# Patient Record
Sex: Female | Born: 1955 | Race: White | Hispanic: No | Marital: Married | Smoking: Never smoker
Health system: Southern US, Community
[De-identification: ages and names within clinical notes are randomized; demographics above are authoritative.]

## PROBLEM LIST (undated history)

## (undated) DIAGNOSIS — I10 Essential (primary) hypertension: Secondary | ICD-10-CM

## (undated) DIAGNOSIS — E785 Hyperlipidemia, unspecified: Secondary | ICD-10-CM

## (undated) DIAGNOSIS — K589 Irritable bowel syndrome without diarrhea: Secondary | ICD-10-CM

## (undated) HISTORY — DX: Hyperlipidemia, unspecified: E78.5

## (undated) HISTORY — DX: Irritable bowel syndrome, unspecified: K58.9

## (undated) HISTORY — DX: Essential (primary) hypertension: I10

## (undated) HISTORY — PX: TONSILLECTOMY: SHX5217

## (undated) HISTORY — PX: COLONOSCOPY: SHX174

---

## 2011-10-27 ENCOUNTER — Ambulatory Visit: Payer: Self-pay | Admitting: Internal Medicine

## 2011-11-26 ENCOUNTER — Ambulatory Visit: Payer: Self-pay | Admitting: Internal Medicine

## 2014-10-26 ENCOUNTER — Ambulatory Visit: Payer: Self-pay | Admitting: Internal Medicine

## 2015-12-21 ENCOUNTER — Other Ambulatory Visit: Payer: Self-pay | Admitting: Internal Medicine

## 2015-12-21 DIAGNOSIS — R911 Solitary pulmonary nodule: Secondary | ICD-10-CM

## 2015-12-26 ENCOUNTER — Ambulatory Visit
Admission: RE | Admit: 2015-12-26 | Discharge: 2015-12-26 | Disposition: A | Discharge status: Home / Self Care | Source: Ambulatory Visit | Attending: Internal Medicine | Admitting: Internal Medicine

## 2015-12-26 DIAGNOSIS — R911 Solitary pulmonary nodule: Secondary | ICD-10-CM | POA: Diagnosis present

## 2017-04-12 IMAGING — CT CT CHEST W/O CM
2 of 4 series · 15 of 36 positions shown, 18 images · non-contrast
Comparison: None.

CLINICAL DATA: Abnormal chest radiograph

EXAM:
CT CHEST WITHOUT CONTRAST
TECHNIQUE: Multidetector CT imaging of the chest was performed following the
standard protocol without IV contrast.

[Series 3: lungs · axial · 0.62mm/px · z∈[-910,-664]mm · 12 of 139 slices shown, 15 images]
[im 8/139  mediastinal]
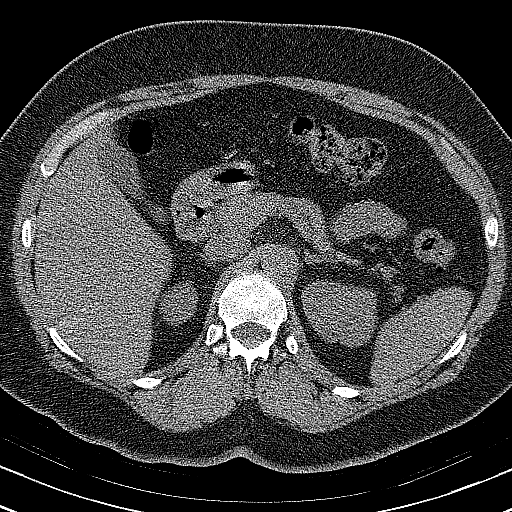
[im 8/139  lung]
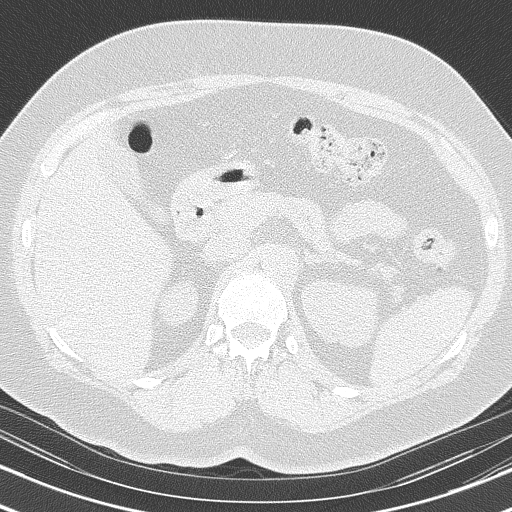
[im 22/139  lung]
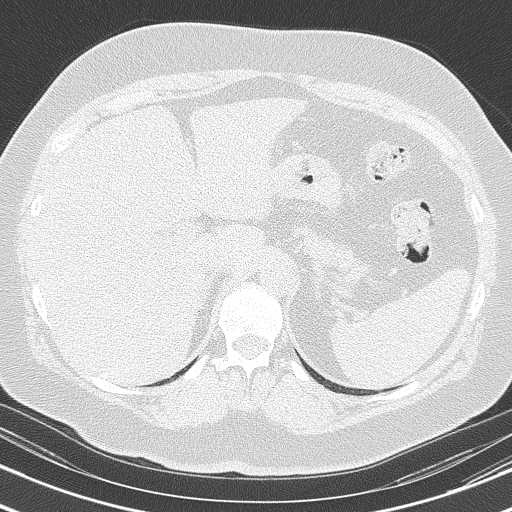
[im 30/139  lung]
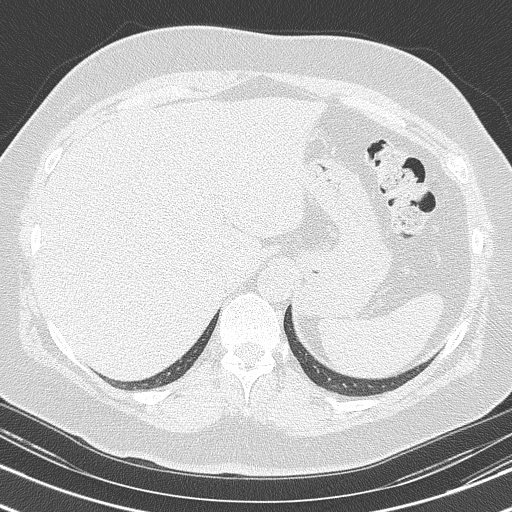
[im 44/139  lung]
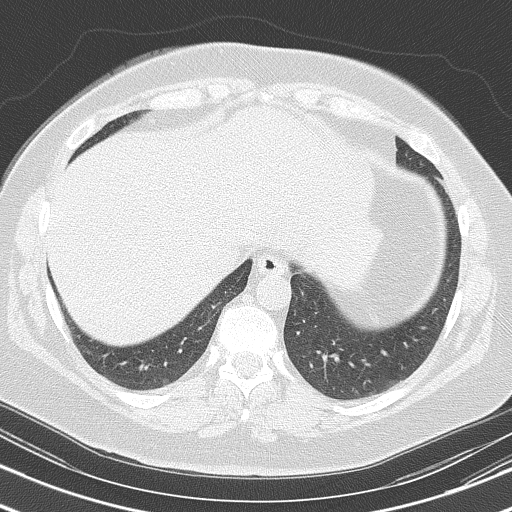
[im 51/139  mediastinal]
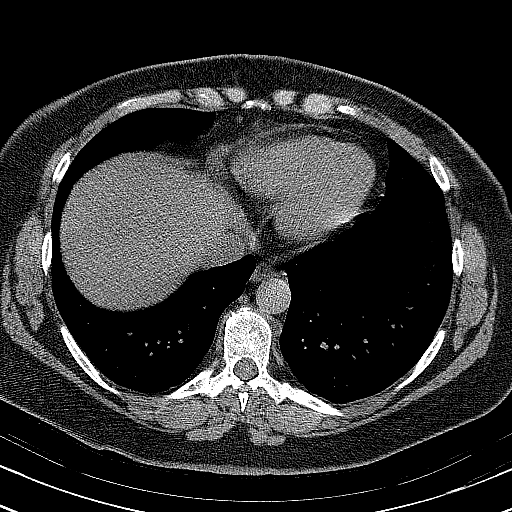
[im 51/139  lung]
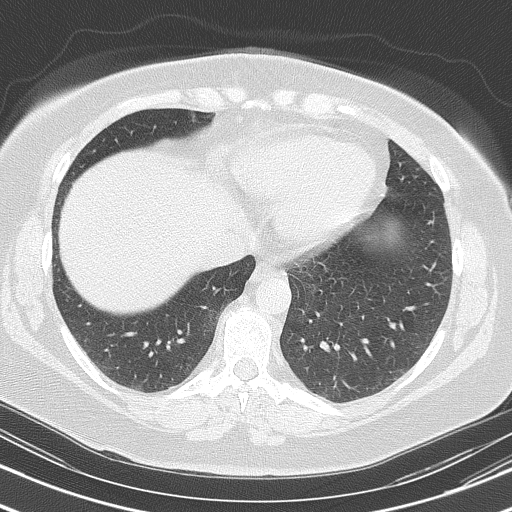
[im 66/139  lung]
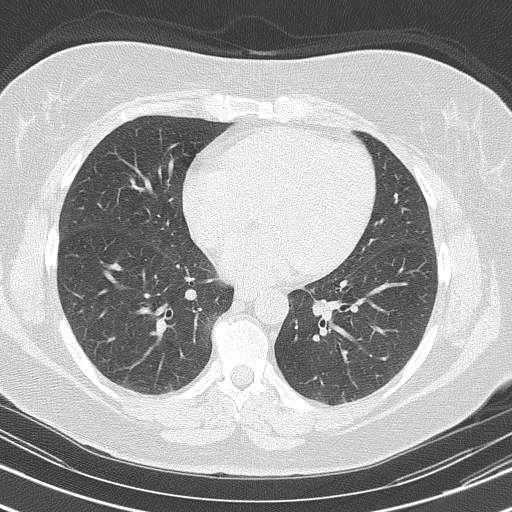
[im 73/139  lung]
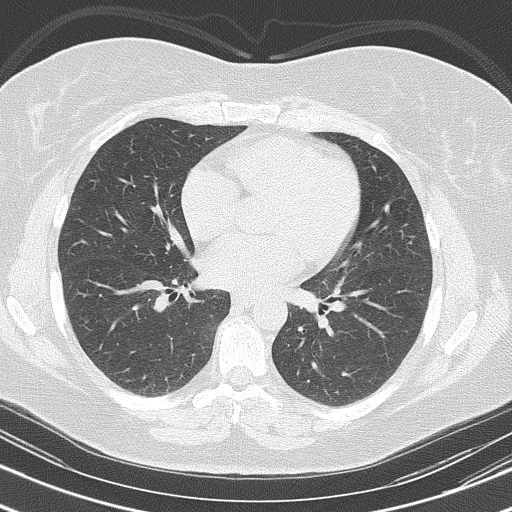
[im 88/139  lung]
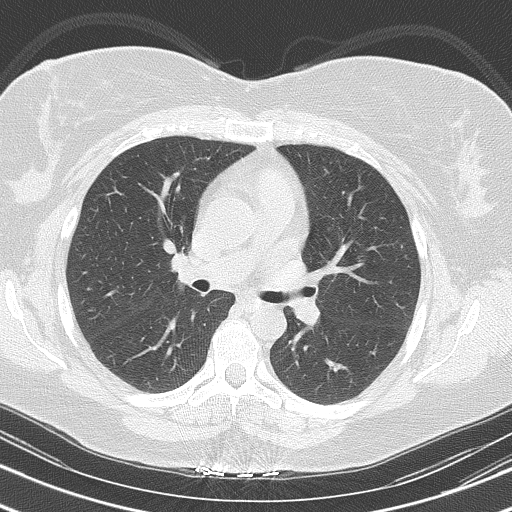
[im 95/139  mediastinal]
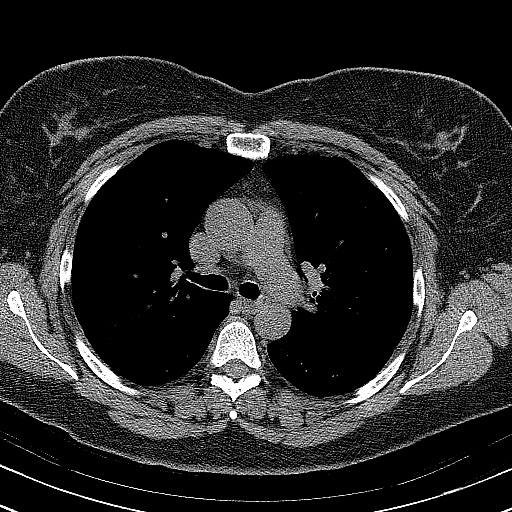
[im 95/139  lung]
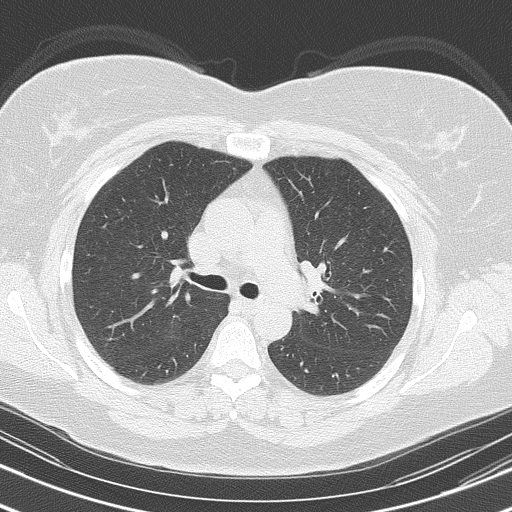
[im 109/139  lung]
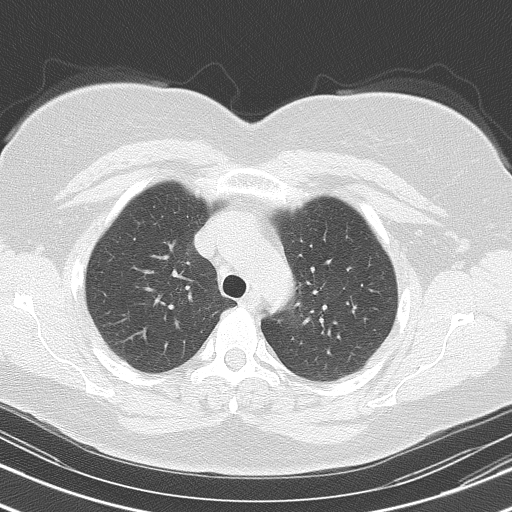
[im 117/139  lung]
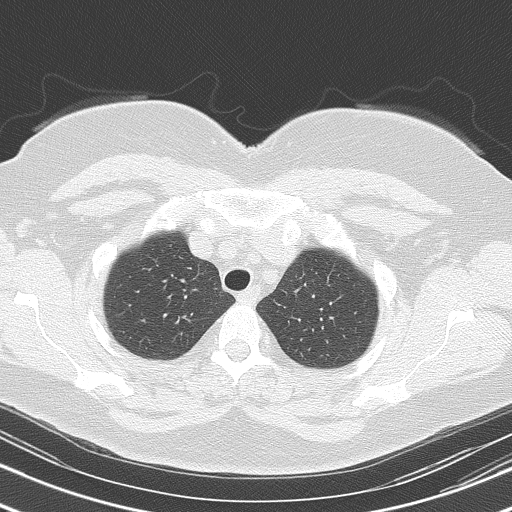
[im 131/139  lung]
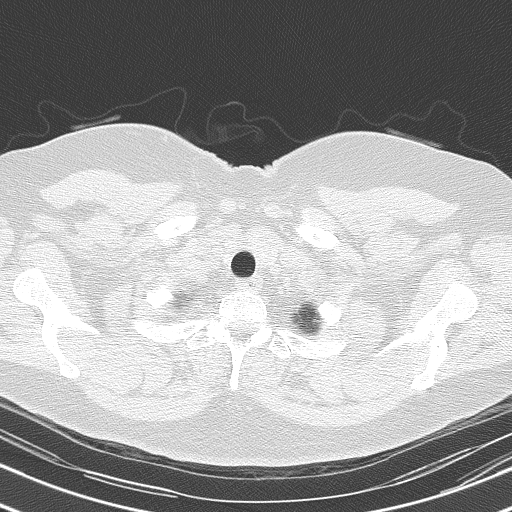

[Series 602: coronal · coronal · 0.62mm/px · 3 of 117 slices shown]
[im 24/117  lung]
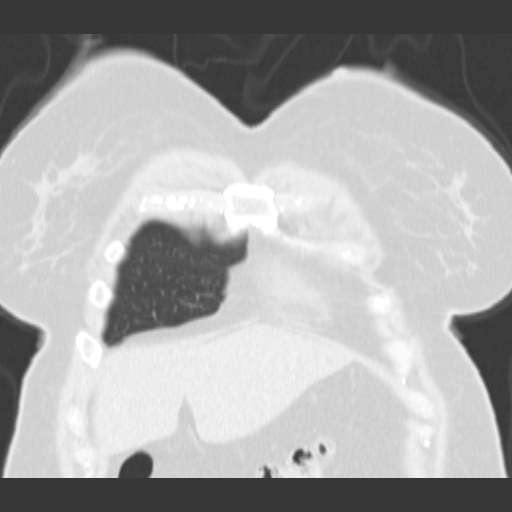
[im 47/117  lung]
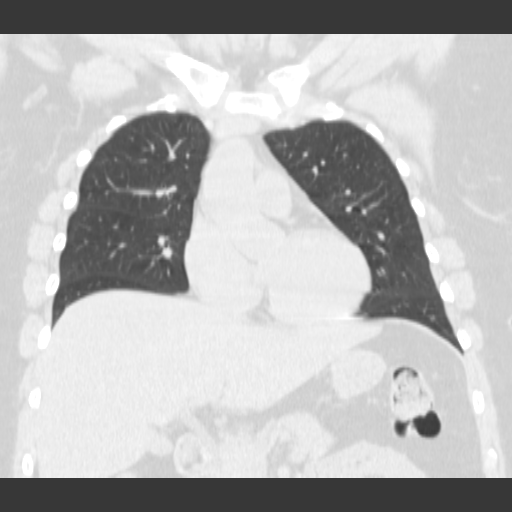
[im 70/117  lung]
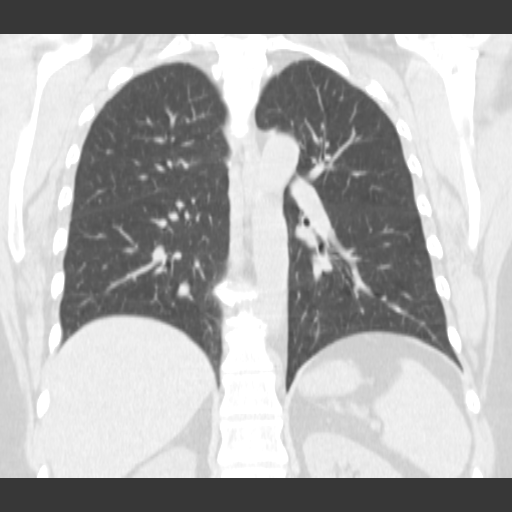

[15 of 36 positions shown; findings below may reference images not displayed]

FINDINGS: Mediastinum/Nodes: The heart is normal in size. No pericardial
effusion.

No evidence of thoracic aortic aneurysm.

Small mediastinal lymph nodes, including a 9 mm short axis superior
mediastinal node (series 2/ image 9) and a 7 mm short axis low right
paratracheal node (series 2/ image 19).

Lungs/Pleura: 3 mm triangular nodule in the right middle lobe
(series 3/ image 57), likely reflecting a benign subpleural lymph
node.

Lungs are otherwise essentially clear.

Mild compressive atelectasis in the medial right lower lobe.

No focal consolidation.

No pleural effusion or pneumothorax.

Upper abdomen: Visualized upper abdomen is unremarkable.

Musculoskeletal: Mild degenerative changes of the visualized
thoracolumbar spine.
IMPRESSION: 3 mm triangular nodule in the right middle lobe, likely reflecting a
benign subpleural lymph node.

No follow-up needed if patient is low-risk. Non-contrast chest CT
can be considered in 12 months if patient is high-risk.

This recommendation follows the consensus statement: Guidelines for
Management of Incidental Pulmonary Nodules Detected on CT
Images:From the [HOSPITAL] 2726; published online before
print (10.1148/[id number]).

## 2017-12-15 ENCOUNTER — Other Ambulatory Visit: Payer: Self-pay | Admitting: Internal Medicine

## 2017-12-15 DIAGNOSIS — M79662 Pain in left lower leg: Secondary | ICD-10-CM

## 2017-12-16 ENCOUNTER — Other Ambulatory Visit (HOSPITAL_COMMUNITY): Payer: Self-pay | Admitting: Internal Medicine

## 2017-12-16 DIAGNOSIS — M79662 Pain in left lower leg: Secondary | ICD-10-CM

## 2017-12-18 ENCOUNTER — Ambulatory Visit (HOSPITAL_COMMUNITY)
Admission: RE | Admit: 2017-12-18 | Discharge: 2017-12-18 | Disposition: A | Discharge status: Home / Self Care | Source: Ambulatory Visit | Attending: Internal Medicine | Admitting: Internal Medicine

## 2017-12-18 DIAGNOSIS — M79662 Pain in left lower leg: Secondary | ICD-10-CM | POA: Insufficient documentation

## 2019-06-08 ENCOUNTER — Ambulatory Visit: Payer: Self-pay | Admitting: Urology

## 2019-06-29 ENCOUNTER — Ambulatory Visit: Payer: Self-pay | Admitting: Urology

## 2019-07-22 ENCOUNTER — Ambulatory Visit (INDEPENDENT_AMBULATORY_CARE_PROVIDER_SITE_OTHER): Admitting: Urology

## 2019-07-22 ENCOUNTER — Encounter: Payer: Self-pay | Admitting: Urology

## 2019-07-22 ENCOUNTER — Other Ambulatory Visit: Payer: Self-pay

## 2019-07-22 VITALS — BP 162/102 | HR 75 | Ht 64.0 in | Wt 165.0 lb

## 2019-07-22 DIAGNOSIS — R3915 Urgency of urination: Secondary | ICD-10-CM | POA: Diagnosis not present

## 2019-07-22 DIAGNOSIS — N3941 Urge incontinence: Secondary | ICD-10-CM | POA: Diagnosis not present

## 2019-07-22 DIAGNOSIS — N393 Stress incontinence (female) (male): Secondary | ICD-10-CM | POA: Diagnosis not present

## 2019-07-22 DIAGNOSIS — N3281 Overactive bladder: Secondary | ICD-10-CM

## 2019-07-22 LAB — URINALYSIS, COMPLETE
Bilirubin, UA: NEGATIVE
Glucose, UA: NEGATIVE
Ketones, UA: NEGATIVE
Leukocytes,UA: NEGATIVE
Nitrite, UA: NEGATIVE
Protein,UA: NEGATIVE
Specific Gravity, UA: <1.005 — ABNORMAL LOW (ref 1.005–1.030)
Urobilinogen, Ur: 0.2 mg/dL (ref 0.2–1.0)
pH, UA: 6 (ref 5.0–7.5)

## 2019-07-22 LAB — MICROSCOPIC EXAMINATION: Bacteria, UA: NONE SEEN

## 2019-07-22 LAB — BLADDER SCAN AMB NON-IMAGING

## 2019-07-22 NOTE — Progress Notes (Signed)
07/22/2019 11:56 AM   Melissa Burnett November 14, 1955 [id number]  Referring provider: Idelle Crouch, MD [facility] [place],  [place] [address]  Chief Complaint  Patient presents with  . Urinary Urgency    New patient    HPI: Melissa Burnett is a 63 y.o. female seen in consultation at the request of Mortimer Fries, [place] for evaluation of overactive bladder symptoms.  She was seen in September 2020 with a 46-month history of urinary frequency, urgency and urge incontinence.  She also has mild stress incontinence.  Denies bowel symptoms or recurrent UTI.  No history of neurologic diseases including MS, Parkinson's or CVA.  Denies recurrent UTI or gross hematuria.  She had a urinalysis and urine culture by her PCP which were unremarkable.  She was not treated for her symptoms.  One prior vaginal delivery.  No previous hysterectomy.   PMH: Past Medical History:  Diagnosis Date  . Hyperlipidemia   . Hypertension   . IBS (irritable bowel syndrome)     Surgical History: Past Surgical History:  Procedure Laterality Date  . COLONOSCOPY    . TONSILLECTOMY      Home Medications:  Allergies as of 07/22/2019      Reactions   Penicillin G Other (See Comments)   Skin peeled off hands and feet   Sulfa Antibiotics Rash      Medication List       Accurate as of July 22, 2019 11:56 AM. If you have any questions, ask your nurse or doctor.        rosuvastatin 10 MG tablet Commonly known as: CRESTOR TAKE 1 TABLET DAILY       Allergies:  Allergies  Allergen Reactions  . Penicillin G Other (See Comments)    Skin peeled off hands and feet  . Sulfa Antibiotics Rash    Family History: No family history on file.  Social History:  reports that she has never smoked. She has never used smokeless tobacco. She reports previous alcohol use. No history on file for drug.  ROS: UROLOGY Frequent Urination?: Yes Hard to postpone urination?: Yes Burning/pain with  urination?: No Get up at night to urinate?: Yes Leakage of urine?: Yes Urine stream starts and stops?: No Trouble starting stream?: No Do you have to strain to urinate?: No Blood in urine?: No Urinary tract infection?: No Sexually transmitted disease?: No Injury to kidneys or bladder?: No Painful intercourse?: No Weak stream?: No Currently pregnant?: No Vaginal bleeding?: No Last menstrual period?: N  Gastrointestinal Nausea?: No Vomiting?: No Indigestion/heartburn?: No Diarrhea?: No Constipation?: No  Constitutional Fever: No Night sweats?: No Weight loss?: No Fatigue?: No  Skin Skin rash/lesions?: No Itching?: No  Eyes Blurred vision?: No Double vision?: No  Ears/Nose/Throat Sore throat?: No Sinus problems?: No  Hematologic/Lymphatic Swollen glands?: No Easy bruising?: No  Cardiovascular Leg swelling?: No Chest pain?: No  Respiratory Cough?: No Shortness of breath?: No  Endocrine Excessive thirst?: No  Musculoskeletal Back pain?: No Joint pain?: No  Neurological Headaches?: No Dizziness?: No  Psychologic Depression?: No Anxiety?: No  Physical Exam: BP (!) 162/102   Pulse 75   Ht 5\' 4"  (1.626 m)   Wt 165 lb (74.8 kg)   BMI 28.32 kg/m   Constitutional:  Alert and oriented, No acute distress. HEENT: [place] AT, moist mucus membranes.  Trachea midline, no masses. Cardiovascular: No clubbing, cyanosis, or edema. Respiratory: Normal respiratory effort, no increased work of breathing. GI: Abdomen is soft, nontender, nondistended,  no abdominal masses GU: External genitalia without abnormalities.  Vaginal mucosa atrophic.  Urethral meatus normal in appearance.  Grade 1 cystocele.  No rectocele.  Bimanual no pelvic masses or bladder tenderness. Lymph: No inguinal lymphadenopathy. Skin: No rashes, bruises or suspicious lesions. Neurologic: Grossly intact, no focal deficits, moving all 4 extremities. Psychiatric: Normal mood and affect.   Laboratory Data:  Urinalysis Dipstick/microscopy negative  Assessment & Plan:    - Overactive bladder with urge incontinence We discussed overactive bladder diagnosis.  PVR by bladder scan was 12 mL.  Management options were reviewed including behavioral therapy, medical management and pelvic floor rehabilitation.  She is interested in pursuing pelvic floor rehab and referral was sent to PT.  - Stress urinary incontinence Mild stress incontinence which pelvic floor rehab should also improve.  Follow-up as needed for persistent symptoms.   Riki Altes, MD  [facility] [address], [address] [place], [place] [address] [phone]

## 2019-08-24 ENCOUNTER — Encounter: Payer: Self-pay | Admitting: Physical Therapy

## 2019-08-24 ENCOUNTER — Ambulatory Visit: Attending: Urology | Admitting: Physical Therapy

## 2019-08-24 ENCOUNTER — Other Ambulatory Visit: Payer: Self-pay

## 2019-08-24 DIAGNOSIS — M6281 Muscle weakness (generalized): Secondary | ICD-10-CM | POA: Diagnosis present

## 2019-08-24 DIAGNOSIS — M533 Sacrococcygeal disorders, not elsewhere classified: Secondary | ICD-10-CM | POA: Insufficient documentation

## 2019-08-24 NOTE — Patient Instructions (Addendum)
     Currently:  1 ( 20 fl oz)  tea,  (24 fl oz) of coffee, diluted juice ( 20 fl oz.)  = 64 fl oz bladder irritants  No water      Decrease bladder irritants: 12 fl oz coffee, and 10 fl of tea and 10 fl oz of juice = 32 fl oz bladder irritants Water 16 fl oz ( lemon)   The next, up the water to 24 fl oz  ( tumblers)   ___   3- point tap ,\ Unlock knees, hip above knee,  feet hip width apart  10 reps each x 3 x day

## 2019-08-24 NOTE — Therapy (Signed)
[place] MAIN [facility] SERVICES [address] [place], [place], [address] Phone: [phone]   Fax:  [phone]  Physical Therapy Evaluation  Patient Details  Name: Melissa Burnett MRN: [id number] Date of Birth: 05-Aug-1956 Referring Provider (PT): Stoiff    Encounter Date: 08/24/2019  PT End of Session - 08/24/19 1158    Visit Number  1    Number of Visits  10   eval 08/24/19   Date for PT Re-Evaluation  11/02/19    PT Start Time  1103    PT Stop Time  1159    PT Time Calculation (min)  56 min       Past Medical History:  Diagnosis Date  . Hyperlipidemia   . Hypertension   . IBS (irritable bowel syndrome)     Past Surgical History:  Procedure Laterality Date  . COLONOSCOPY    . TONSILLECTOMY      There were no vitals filed for this visit.   Subjective Assessment - 08/24/19 1109    Subjective 1) mix incontinence:  Pt reports urinary leakage started 6 months ago with lifting and moving boxes. Pt also noticed urge to go quickly . Pt has leakage before making it before the bathrooom 90% of the time . Pt uses 4-6 x pads per day. Pt does not get up in the middle of the night ( zero nocturia episodes) . Pt feels she does not emptyly empty all the way with urination. Daily fluid intake: Summer time 3-4 ( 20 fl oz) diluted tea, Winter time 1 ( 20 fl oz)  tea,  2 - 12 fl oz of coffee, pt does not like actual water,  Diluted juice 20 fl oz.     2) IBS-diarrhea:  bouts of IBS occuring 3 x a month. Pt does not eat enough fiber. Pt takes fiber pills ,. Likes veggies. no straining with BMs. Without IBS bouts, pt goes 2 x day. [place] Stool Type 4 , with IBS episodes Type 6-7    3)  tailbone discomfort with sitting in same position > 15 min. Hx of fall onto her tailbone and injury to the tailbone during childbirth    4) sciatic pain on L traveling down about knee. 2-3/10. It gets tight and pt does stretches . [place], climbing ladders, and  opening up a store.     Pertinent History           [facility] PT Assessment - 08/24/19 1142      Assessment   Medical Diagnosis  stress incontinence    Referring Provider (PT)  Stoiff       Precautions   Precautions  None      Restrictions   Weight Bearing Restrictions  No      Balance Screen   Has the patient fallen in the past 6 months  No      Observation/Other Assessments   Observations  L shoulder lower      Coordination   Gross Motor Movements are Fluid and Coordinated  --   chest breathing      AROM   Overall AROM Comments  WFL spinal movements, no pain .       PROM   Overall PROM Comments  R hip  ext preTx 0 deg, post Tx ~ 20 deg       Strength   Overall Strength Comments  R hip flex/knee flex/ ext 4-/5, L 5/5  Palpation   Spinal mobility  slight L convex curve     SI assessment   tenderness/ restrictions R sacral nutation ( post Tx improved, no tenderness)     Palpation comment  L iliac crest higher in stance ,  coccyx deviated to R,       Bed Mobility   Bed Mobility  --   head crunch               Objective measurements completed on examination: See above findings.    Pelvic Floor Special Questions - 08/24/19 1158    Diastasis Recti  to assess next session     External Perineal Exam  through clothing: L anterior mm tightness.  proper activation of pelvic floor with exhalation       [facility] Adult PT Treatment/Exercise - 08/24/19 1142      Therapeutic Activites    Therapeutic Activities  --   discussed bladder irritants, increasing water intake,    Other Therapeutic Activities  --   completing food diary to return, anatomy/ physiology     Neuro Re-ed    Neuro Re-ed Details   cued for new HEP      Modalities   Modalities  --      Manual Therapy   Manual therapy comments  long axis distraction on R, roational mob to promote SIJ mobility on R                   PT Long Term Goals - 08/24/19 1111      PT LONG TERM  GOAL #1   Title  Ptdecrease  leakage before making it before the bathrooom 90% of the time to 50% of the time to demo continence    Time  6    Period  Weeks    Status  New    Target Date  10/05/19      PT LONG TERM GOAL #2   Title  Pt will report changing pads from 4-6 x day to < 3 x day in order to practice hygiene and to perform tasks    Time  8    Period  Weeks    Status  New    Target Date  10/19/19      PT LONG TERM GOAL #3   Title  Pt will decrease her bladder inrritant intake by 50% and increase her water intake by 50% in order to promote bladder health and less trips tot he bathroom    Time  4    Period  Weeks    Status  New    Target Date  09/21/19      PT LONG TERM GOAL #4   Title  PT will report decreased IBS episodes from 3 x month to < 2 x month and stool type from 6-7 to 4-5 in order to promote bowel and GI health    Time  8    Period  Weeks    Status  New    Target Date  10/19/19      PT LONG TERM GOAL #5   Title  Pt will demo proper deep core coordination and strengtehning exericses to promote intraabdominal press for motility, urinary and bowel function    Time  10    Period  Weeks    Status  New      Additional Long Term Goals   Additional Long Term Goals  --      PT LONG TERM GOAL #6  Title  Pt will be sit for > 15 min without moving and shifting and feel comfort on her tailbone in order to have dinner with family    Time  4    Period  Weeks    Status  New    Target Date  09/21/19      PT LONG TERM GOAL #7   Title  Pt will will report no sciatic Sx across 1 month 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 These deficits impact her functional mobility and ADLs. Pt presents with  weak hip weakness, SIJ hypomobility, one sided pelvic floor mm tightness, pelvic obliquities, poor body mechanics which places strain on the abdominal/pelvic floor mm. These are deficits that indicate an ineffective intraabdominal pressure system associated with increased risk for urinary leakage, motility, and spinal stability.   Pt was provided education on etiology of Sx with anatomy, physiology explanation with images along with the benefits of customized pelvic PT Tx based on pt's medical conditions and musculoskeletal deficits.  Explained the physiology of deep core mm coordination and roles of pelvic floor function in urination, defecation, sexual function, and postural control with deep core mm system.    Regional interdependent approaches will yield greater benefits in pt's POC due to the complexity of her medical Hx and the significant impact their Sx have had on their QOL.   Pt's Hx of fall onto tailbone many years ago along with tailbone injury during childbirth are likely contributing factors to tailbone discomfort. IBS-diarrhea are likely impacted by her poor water intake and high intake of tea, juice, and coffees but pt was asked to complete food diary to return at next session to better understand fiber intake and refer to nutritionist if need.     Following Tx today which pt tolerated without complaints, pt demo'd leveled iliac crest alignment,  increased R SIJ mobility, increased hip ext AROM, increased sacral nutation. Pt was educated about decreasing bladder irritants, increasing water intake and provided motivational interviewing to increase compliance.    Pt  benefits from skilled PT.      Personal Factors and Comorbidities  Age;Comorbidity 2    Comorbidities  IBS HTN, cholesterol    Examination-Activity Limitations  Sit    Stability/Clinical Decision Making  Evolving/Moderate complexity    Clinical Decision Making  Moderate    Rehab Potential  Good    PT Frequency  1x / week    PT Duration  Other (comment)   10   PT Treatment/Interventions  Manual techniques;Patient/family education;Therapeutic exercise;Therapeutic activities;Functional mobility training;Stair training;Neuromuscular re-education;Energy conservation;Moist Heat    Consulted and Agree  with Plan of Care  Patient       Patient will benefit from skilled therapeutic intervention in order to improve the following deficits and impairments:  Decreased mobility, Decreased coordination, Decreased range of motion, Decreased strength, Decreased endurance, Decreased safety awareness, Improper body mechanics, Postural dysfunction, Increased muscle spasms, Hypomobility, Pain  Visit Diagnosis: Sacrococcygeal disorders, not elsewhere classified  Muscle weakness (generalized)     Problem List Patient Active Problem List   Diagnosis Date Noted  . Overactive bladder 07/22/2019  . Urge incontinence 07/22/2019  . Stress incontinence, female 07/22/2019    Mariane Masters 08/24/2019, 2:16 PM  [facility] MAIN [facility] SERVICES [address] [place], [place], [address] Phone: [phone]   Fax:  [phone]  Name: Sadira Standard MRN: [id number] Date of Birth: 11/01/1955

## 2019-09-01 ENCOUNTER — Encounter: Admitting: Physical Therapy

## 2019-09-08 ENCOUNTER — Ambulatory Visit: Admitting: Physical Therapy

## 2019-09-08 ENCOUNTER — Other Ambulatory Visit: Payer: Self-pay

## 2019-09-08 DIAGNOSIS — M6281 Muscle weakness (generalized): Secondary | ICD-10-CM

## 2019-09-08 DIAGNOSIS — M533 Sacrococcygeal disorders, not elsewhere classified: Secondary | ICD-10-CM | POA: Diagnosis not present

## 2019-09-08 NOTE — Therapy (Signed)
[place] [facility] MAIN [facility] SERVICES [address] [place], [place], [address] Phone: [phone]   Fax:  [phone]  Physical Therapy Treatment  Patient Details  Name: Melissa Burnett MRN: [id number] Date of Birth: 27-Jul-1956 Referring Provider (PT): Stoiff    Encounter Date: 09/08/2019  PT End of Session - 09/08/19 1126    Visit Number  2    Number of Visits  10   eval 08/24/19   Date for PT Re-Evaluation  11/02/19    PT Start Time  1107    PT Stop Time  1202    PT Time Calculation (min)  55 min       Past Medical History:  Diagnosis Date  . Hyperlipidemia   . Hypertension   . IBS (irritable bowel syndrome)     Past Surgical History:  Procedure Laterality Date  . COLONOSCOPY    . TONSILLECTOMY      There were no vitals filed for this visit.  Subjective Assessment - 09/08/19 1111    Subjective  Pt forgot to bring in her food diary . Pt feels like her balance is a little of with the toe tap exercise.  Pt is working out at [employer] and notices leakage is better. Pt is using the crunch machine, recumbent elliptical, leg press machine, tricep, bicep, pull down. .    Pertinent History  LBP 3 months ago, pt was worries about getting a kidney stone. Pt took a supplement for 30 days and decreased tea. Pt has not had problems.  1 vaginal delivery.  Fall onto her tailbone and injury to the tailbone during childbirth         [facility] PT Assessment - 09/08/19 1156      Observation/Other Assessments   Observations  L shoulder lower      Palpation   Spinal mobility  increased tightness at medial scapular, paraspinals    post Tx: L convex lumbar curved decreased    Palpation comment  iliac crest levelled                    [facility] Adult PT Treatment/Exercise - 09/08/19 1157      Therapeutic Activites    Other Therapeutic Activities  modification to fitness routine to minimize straining pelvic floor      Neuro Re-ed    Neuro Re-ed  Details   technique for log rolling and exhalation on exertion in fitness routines      Exercises   Exercises  --   see pt isntruction to increase thoracic rotation     Moist Heat Therapy   Number Minutes Moist Heat  --   5   Moist Heat Location  --   thoracic     Manual Therapy   Manual therapy comments  medial scapular, parspinals mm releases with SMT/ MWM                   PT Long Term Goals - 08/24/19 1111      PT LONG TERM GOAL #1   Title  Ptdecrease  leakage before making it before the bathrooom 90% of the time to 50% of the time to demo continence    Time  6    Period  Weeks    Status  New    Target Date  10/05/19      PT LONG TERM GOAL #2   Title  Pt will report changing pads from 4-6 x day to < 3  x day in order to practice hygiene and to perform tasks    Time  8    Period  Weeks    Status  New    Target Date  10/19/19      PT LONG TERM GOAL #3   Title  Pt will decrease her bladder inrritant intake by 50% and increase her water intake by 50% in order to promote bladder health and less trips tot he bathroom    Time  4    Period  Weeks    Status  New    Target Date  09/21/19      PT LONG TERM GOAL #4   Title  PT will report decreased IBS episodes from 3 x month to < 2 x month and stool type from 6-7 to 4-5 in order to promote bowel and GI health    Time  8    Period  Weeks    Status  New    Target Date  10/19/19      PT LONG TERM GOAL #5   Title  Pt will demo proper deep core coordination and strengtehning exericses to promote intraabdominal press for motility, urinary and bowel function    Time  10    Period  Weeks    Status  New      Additional Long Term Goals   Additional Long Term Goals  --      PT LONG TERM GOAL #6   Title  Pt will be sit for > 15 min without moving and shifting and feel comfort on her tailbone in order to have dinner with family    Time  4    Period  Weeks    Status  New    Target Date  09/21/19      PT LONG TERM  GOAL #7   Title  Pt will will report no sciatic Sx 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 Pt required modifications and explanations to fitness exercises with weight machines to minimize straining pelvic floor. Pt also required manual Tx to minimize tightness at T/L junction which helped pt to decrease L convex curve and to help her achieve more comfort to lie on her back which will help with her sleep quality. Pt still showed lower L shoulder height and thus, more assessment will be applied at upcoming session in order to minimize risks for injuries with gym weight activities. Plan to add thoracolumbar strengthening at next session. Pt continues to benefit from skilled PT   Personal Factors and Comorbidities  Age;Comorbidity 2    Comorbidities  IBS HTN, cholesterol    Examination-Activity Limitations  Sit    Stability/Clinical Decision Making  Evolving/Moderate complexity  Rehab Potential  Good    PT Frequency  1x / week    PT Duration  Other (comment)   10   PT Treatment/Interventions  Manual techniques;Patient/family education;Therapeutic exercise;Therapeutic activities;Functional mobility training;Stair training;Neuromuscular re-education;Energy conservation;Moist Heat    Consulted and Agree with Plan of Care  Patient       Patient will benefit from skilled therapeutic intervention in order to improve the following deficits and impairments:  Decreased mobility, Decreased coordination, Decreased  range of motion, Decreased strength, Decreased endurance, Decreased safety awareness, Improper body mechanics, Postural dysfunction, Increased muscle spasms, Hypomobility, Pain  Visit Diagnosis: Muscle weakness (generalized)  Sacrococcygeal disorders, not elsewhere classified     Problem List Patient Active Problem List   Diagnosis Date Noted  . Overactive bladder 07/22/2019  . Urge incontinence 07/22/2019  . Stress incontinence, female 07/22/2019    Jerl Mina ,PT, DPT, E-RYT  09/08/2019, 5:20 PM  [place] MAIN [facility] SERVICES [address] [place], [place], [address] Phone: [phone]   Fax:  [phone]  Name: Melissa Burnett MRN: [id number] Date of Birth: [birth year]

## 2019-09-08 NOTE — Patient Instructions (Addendum)
Ensure water intake the mornings and after workout   _____  Stretch for midback  Open book ( handout)    Sleeping with pillow under knees for back relief  Guided relaxation    Avoid straining pelvic floor, abdominal muscles , spine  Use log rolling technique instead of getting out of bed with your neck or the sit-up     Log rolling into and out of bed   Log rolling into and out of bed If getting out of bed on R side, Bent knees, scoot hips/ shoulder to L  Raise R arm completely overhead, rolling onto armpit  Then lower bent knees to bed to get into complete side lying position  Then drop legs off bed, and push up onto R elbow/forearm, and use L hand to push onto the bed      Fitness routine modifications to minimize bearing down on pelvic floor:  _Do not use the crunch machine because there is a downward force onto the pelvic floor   _Exhale on exertion with leg press  _tricep/ bicep press:shoulders back and down, chin tuck, push with arms exhale , elbows at the ribs . Feel shoulder blades squeezing with the movement    _lat pull in mini squat   Minisquat: Scoot buttocks back slight, hinge like you are looking at your reflection on a pond  Knees behind toes,  Inhale to "smell flowers"  Exhale on the rise "like rocket"  Do not lock knees, have more weight across ballmounds of feet, toes relaxed

## 2019-09-14 ENCOUNTER — Ambulatory Visit: Admitting: Physical Therapy

## 2019-09-21 ENCOUNTER — Ambulatory Visit: Admitting: Physical Therapy

## 2019-09-28 ENCOUNTER — Ambulatory Visit: Attending: Urology | Admitting: Physical Therapy

## 2019-10-06 ENCOUNTER — Encounter: Admitting: Physical Therapy

## 2020-02-15 ENCOUNTER — Other Ambulatory Visit: Payer: Self-pay | Admitting: Internal Medicine

## 2020-02-15 ENCOUNTER — Telehealth: Payer: Self-pay | Admitting: Adult Health

## 2020-02-15 DIAGNOSIS — Z1231 Encounter for screening mammogram for malignant neoplasm of breast: Secondary | ICD-10-CM

## 2020-02-15 NOTE — Telephone Encounter (Signed)
I connected by phone with Lodema Pilot and/or patient's caregiver on 02/15/2020 at 6:58 PM to discuss the potential vaccination through our Homebound vaccination initiative.   Prevaccination Checklist for COVID-19 Vaccines  1.  Are you feeling sick today? no  2.  Have you ever received a dose of a COVID-19 vaccine?  no      If yes, which one? None   3.  Have you ever had an allergic reaction: (This would include a severe reaction [ e.g., anaphylaxis] that required treatment with epinephrine or EpiPen or that caused you to go to the hospital.  It would also include an allergic reaction that occurred within 4 hours that caused hives, swelling, or respiratory distress, including wheezing.) A.  A previous dose of COVID-19 vaccine. no  B.  A vaccine or injectable therapy that contains multiple components, one of which is a COVID-19 vaccine component, but it is not known which component elicited the immediate reaction. no  C.  Are you allergic to polyethylene glycol? no  D. Are you allergic to Polysorbate, which is found in some vaccines, film coated tablets and intravenous steroids?  no   4.  Have you ever had an allergic reaction to another vaccine (other than COVID-19 vaccine) or an injectable medication? (This would include a severe reaction [ e.g., anaphylaxis] that required treatment with epinephrine or EpiPen or that caused you to go to the hospital.  It would also include an allergic reaction that occurred within 4 hours that caused hives, swelling, or respiratory distress, including wheezing.)  no   5.  Have you ever had a severe allergic reaction (e.g., anaphylaxis) to something other than a component of the COVID-19 vaccine, or any vaccine or injectable medication?  This would include food, pet, venom, environmental, or oral medication allergies.  mild allergies to PCN and sulfa   6.  Have you received any vaccine in the last 14 days? no   7.  Have you ever had a positive test for COVID-19 or  has a doctor ever told you that you had COVID-19?  no   8.  Have you received passive antibody therapy (monoclonal antibodies or convalescent serum) as a treatment for COVID-19? no   9.  Do you have a weakened immune system caused by something such as HIV infection or cancer or do you take immunosuppressive drugs or therapies?  no   10.  Do you have a bleeding disorder or are you taking a blood thinner? no   11.  Are you pregnant or breast-feeding? no   12.  Do you have dermal fillers? no   __________________   This patient is a 64 y.o. female that meets the FDA criteria to receive homebound vaccination. Patient or parent/caregiver understands they have the option to accept or refuse homebound vaccination.  Patient passed the pre-screening checklist and would like to proceed with homebound vaccination.  Based on questionnaire above, I recommend the patient be observed for 15 minutes.  There are an estimated #1 other household members/caregivers who are also interested in receiving the vaccine.      I will send the patient's information to our scheduling team who will reach out to schedule the patient and potential caregiver/family members for homebound vaccination.    Noreene Filbert 02/15/2020 6:58 PM

## 2020-03-31 LAB — COLOGUARD: COLOGUARD: NEGATIVE

## 2021-02-20 ENCOUNTER — Other Ambulatory Visit: Payer: Self-pay | Admitting: Internal Medicine

## 2021-02-20 DIAGNOSIS — Z1231 Encounter for screening mammogram for malignant neoplasm of breast: Secondary | ICD-10-CM

## 2021-06-20 ENCOUNTER — Ambulatory Visit
Admission: RE | Admit: 2021-06-20 | Discharge: 2021-06-20 | Disposition: A | Discharge status: Home / Self Care | Source: Ambulatory Visit | Attending: Internal Medicine | Admitting: Internal Medicine

## 2021-06-20 ENCOUNTER — Other Ambulatory Visit: Payer: Self-pay

## 2021-06-20 DIAGNOSIS — Z1231 Encounter for screening mammogram for malignant neoplasm of breast: Secondary | ICD-10-CM | POA: Insufficient documentation

## 2021-06-24 ENCOUNTER — Other Ambulatory Visit: Payer: Self-pay | Admitting: Internal Medicine

## 2021-06-26 ENCOUNTER — Other Ambulatory Visit: Payer: Self-pay | Admitting: Internal Medicine

## 2021-06-26 DIAGNOSIS — R928 Other abnormal and inconclusive findings on diagnostic imaging of breast: Secondary | ICD-10-CM

## 2021-06-26 DIAGNOSIS — N6489 Other specified disorders of breast: Secondary | ICD-10-CM

## 2021-08-27 ENCOUNTER — Other Ambulatory Visit: Payer: Self-pay

## 2021-08-27 ENCOUNTER — Ambulatory Visit
Admission: RE | Admit: 2021-08-27 | Discharge: 2021-08-27 | Disposition: A | Discharge status: Home / Self Care | Source: Ambulatory Visit | Attending: Internal Medicine | Admitting: Internal Medicine

## 2021-08-27 DIAGNOSIS — R928 Other abnormal and inconclusive findings on diagnostic imaging of breast: Secondary | ICD-10-CM

## 2021-08-27 DIAGNOSIS — N6489 Other specified disorders of breast: Secondary | ICD-10-CM

## 2023-02-11 ENCOUNTER — Other Ambulatory Visit: Payer: Self-pay

## 2023-02-11 DIAGNOSIS — Z1231 Encounter for screening mammogram for malignant neoplasm of breast: Secondary | ICD-10-CM

## 2024-03-28 ENCOUNTER — Other Ambulatory Visit
Admission: RE | Admit: 2024-03-28 | Discharge: 2024-03-28 | Disposition: A | Discharge status: Home / Self Care | Source: Ambulatory Visit | Attending: Student | Admitting: Student

## 2024-03-28 DIAGNOSIS — M546 Pain in thoracic spine: Secondary | ICD-10-CM | POA: Diagnosis present

## 2024-03-28 LAB — D-DIMER, QUANTITATIVE: D-Dimer, Quant: <0.27 ug{FEU}/mL (ref 0.00–0.50)

## 2024-03-28 LAB — TROPONIN I (HIGH SENSITIVITY): Troponin I (High Sensitivity): <2 ng/L (ref <18)

## 2024-07-19 ENCOUNTER — Other Ambulatory Visit: Payer: Self-pay | Admitting: Internal Medicine

## 2024-07-19 DIAGNOSIS — Z1231 Encounter for screening mammogram for malignant neoplasm of breast: Secondary | ICD-10-CM
